# Patient Record
Sex: Male | Born: 1980 | Race: White | Hispanic: No | Marital: Single | Smoking: Current every day smoker
Health system: Southern US, Community
[De-identification: ages and names within clinical notes are randomized; demographics above are authoritative.]

---

## 2009-06-01 ENCOUNTER — Encounter: Admission: RE | Admit: 2009-06-01 | Discharge: 2009-06-01 | Payer: Self-pay | Admitting: Orthopedic Surgery

## 2010-12-28 ENCOUNTER — Ambulatory Visit
Admission: RE | Admit: 2010-12-28 | Discharge: 2010-12-28 | Disposition: A | Discharge status: Home / Self Care | Payer: Self-pay | Source: Ambulatory Visit | Attending: Unknown Physician Specialty | Admitting: Unknown Physician Specialty

## 2010-12-28 ENCOUNTER — Other Ambulatory Visit: Payer: Self-pay | Admitting: Unknown Physician Specialty

## 2010-12-28 DIAGNOSIS — R0602 Shortness of breath: Secondary | ICD-10-CM

## 2010-12-28 DIAGNOSIS — T1490XA Injury, unspecified, initial encounter: Secondary | ICD-10-CM

## 2021-10-23 ENCOUNTER — Emergency Department (INDEPENDENT_AMBULATORY_CARE_PROVIDER_SITE_OTHER)
Admission: EM | Admit: 2021-10-23 | Discharge: 2021-10-23 | Disposition: A | Discharge status: Home / Self Care | Payer: BC Managed Care – PPO | Source: Home / Self Care

## 2021-10-23 ENCOUNTER — Emergency Department (INDEPENDENT_AMBULATORY_CARE_PROVIDER_SITE_OTHER): Payer: BC Managed Care – PPO

## 2021-10-23 DIAGNOSIS — S92535A Nondisplaced fracture of distal phalanx of left lesser toe(s), initial encounter for closed fracture: Secondary | ICD-10-CM

## 2021-10-23 DIAGNOSIS — M79672 Pain in left foot: Secondary | ICD-10-CM

## 2021-10-23 DIAGNOSIS — S99922A Unspecified injury of left foot, initial encounter: Secondary | ICD-10-CM

## 2021-10-23 MED ORDER — HYDROCODONE-ACETAMINOPHEN 5-325 MG PO TABS: 1.0000 | ORAL_TABLET | Freq: Two times a day (BID) | ORAL | 0 refills | Status: AC | PRN

## 2021-10-23 MED ORDER — DOXYCYCLINE HYCLATE 100 MG PO CAPS: 100.0000 mg | ORAL_CAPSULE | Freq: Two times a day (BID) | ORAL | 0 refills | Status: AC

## 2021-10-23 MED ORDER — IBUPROFEN 800 MG PO TABS: 800.0000 mg | ORAL_TABLET | Freq: Two times a day (BID) | ORAL | 0 refills | Status: AC | PRN

## 2021-10-23 NOTE — ED Provider Notes (Signed)
?[facility] ? ? ? ?CSN: [id number] ?Arrival date & time: 10/23/21  1801 ? ? ?  ? ?History   ?Chief Complaint ?Chief Complaint  ?Patient presents with  ? Toe Injury  ?  2nd toe, LT foot  ? ? ?HPI ?Gregory Shepard is a 41 y.o. male.  ? ?HPI 41 year old male presents with left foot pain Sass second toe pain since dropping plywood on his foot.  Bleeding controlled by direct pressure, patient reports Tdap up-to-date and current pain as 3 of 10. ? ?History reviewed. No pertinent past medical history. ? ?There are no problems to display for this patient. ? ? ?History reviewed. No pertinent surgical history. ? ? ? ? ?Home Medications   ? ?Prior to Admission medications   ?Medication Sig Start Date End Date Taking? Authorizing Provider  ?doxycycline (VIBRAMYCIN) 100 MG capsule Take 1 capsule (100 mg total) by mouth 2 (two) times daily for 10 days. 10/23/21 11/02/21 Yes Trevor Ihaagan, Kema Santaella, FNP  ?HYDROcodone-acetaminophen (NORCO) 5-325 MG tablet Take 1 tablet by mouth 2 (two) times daily as needed for moderate pain. 10/23/21  Yes Trevor Ihaagan, Krystyl Cannell, FNP  ?ibuprofen (ADVIL) 800 MG tablet Take 1 tablet (800 mg total) by mouth 2 (two) times daily as needed. 10/23/21  Yes Trevor Ihaagan, Madicyn Mesina, FNP  ? ? ?Family History ?History reviewed. No pertinent family history. ? ?Social History ?Social History  ? ?Tobacco Use  ? Smoking status: Every Day  ?  Packs/day: 0.50  ?  Types: Cigarettes  ? Smokeless tobacco: Never  ?Vaping Use  ? Vaping Use: Never used  ?Substance Use Topics  ? Alcohol use: Yes  ? ? ? ?Allergies   ?Penicillins ? ? ?Review of Systems ?Review of Systems  ?Musculoskeletal:   ?     Left foot/second/third toe pain secondary to dropping wood boards on left foot earlier today.  ?All other systems reviewed and are negative. ? ? ?Physical Exam ?Triage Vital Signs ?ED Triage Vitals  ?Enc Vitals Group  ?   BP --   ?   Pulse Rate 10/23/21 1844 80  ?   Resp 10/23/21 1844 17  ?   Temp --   ?   Temp Source 10/23/21 1844 Oral  ?   SpO2 10/23/21  1844 98 %  ?   Weight --   ?   Height --   ?   Head Circumference --   ?   Peak Flow --   ?   Pain Score 10/23/21 1846 3  ?   Pain Loc --   ?   Pain Edu? --   ?   Excl. in GC? --   ? ?No data found. ? ?Updated Vital Signs ?Pulse 80   Resp 17   SpO2 98%  ? ? ?Physical Exam ?Vitals and nursing note reviewed.  ?Constitutional:   ?   Appearance: Normal appearance. He is normal weight.  ?HENT:  ?   Head: Normocephalic and atraumatic.  ?   Mouth/Throat:  ?   Mouth: Mucous membranes are moist.  ?   Pharynx: Oropharynx is clear.  ?Eyes:  ?   Extraocular Movements: Extraocular movements intact.  ?   Conjunctiva/sclera: Conjunctivae normal.  ?   Pupils: Pupils are equal, round, and reactive to light.  ?Cardiovascular:  ?   Rate and Rhythm: Normal rate and regular rhythm.  ?   Pulses: Normal pulses.  ?   Heart sounds: Normal heart sounds.  ?Pulmonary:  ?   Effort: Pulmonary effort is normal.  ?  Breath sounds: Normal breath sounds. No wheezing, rhonchi or rales.  ?Musculoskeletal:  ?   Cervical back: Normal range of motion and neck supple.  ?   Comments: Left foot (dorsum): TTP over second and third toes, with mild skin abrasion noted over second distal phalanx, homeostasis achieved by direct pressure  ?Skin: ?   General: Skin is warm and dry.  ?Neurological:  ?   Mental Status: He is alert.  ? ? ? ?UC Treatments / Results  ?Labs ?(all labs ordered are listed, but only abnormal results are displayed) ?Labs Reviewed - No data to display ? ?EKG ? ? ?Radiology ?DG Foot Complete Left ? ?Result Date: 10/23/2021 ?CLINICAL DATA:  Dropped heavy object on foot.  Injury to 2nd toe. EXAM: LEFT FOOT - COMPLETE 3+ VIEW COMPARISON:  None. FINDINGS: Fractures are noted through the distal phalanges of the left 2nd and 3rd toes. No subluxation or dislocation. No radiopaque foreign bodies. Joint spaces maintained. IMPRESSION: Left 2nd and 3rd distal phalangeal fractures. Electronically Signed   By: Charlett Nose M.D.   On: 10/23/2021 19:12    ? ?Procedures ?Procedures (including critical care time) ? ?Medications Ordered in UC ?Medications - No data to display ? ?Initial Impression / Assessment and Plan / UC Course  ?I have reviewed the triage vital signs and the nursing notes. ? ?Pertinent labs & imaging results that were available during my care of the patient were reviewed by me and considered in my medical decision making (see chart for details). ? ?  ? ?MDM: 1.  Nondisplaced fracture of distal phalanx of lesser toes, initial encounter for closed fracture-Rx'd Ibuprofen and Norco. Advised patient of left foot x-ray results with hard copy provided to patient.  Advised patient may use Ibuprofen 800 mg 1-2 times daily, as needed pain and swelling of left second and third toes.  Advised patient may use Norco for breakthrough pain.  Advised patient to contact [place] orthopedic provider first thing tomorrow morning, Wednesday, 10/24/2021 for further evaluation of left second and third distal phalanx fractures.  Advised patient to wear left cam walker boot 24/7 (except when bathing and sleeping) until follow-up with orthopedic provider.  ?Final Clinical Impressions(s) / UC Diagnoses  ? ?Final diagnoses:  ?Left foot pain  ?Nondisplaced fracture of distal phalanx of left lesser toe(s), initial encounter for closed fracture  ? ? ? ?Discharge Instructions   ? ?  ?Advised patient of left foot x-ray results with hard copy provided to patient.  Advised patient may use Ibuprofen 800 mg 1-2 times daily, as needed pain and swelling of left second and third toes.  Advised patient may use Norco for breakthrough pain.  Advised patient to contact  orthopedic provider first thing tomorrow morning, Wednesday, 10/24/2021 for further evaluation of left second and third distal phalanx fractures.  Advised patient to wear left cam walker boot 24/7 (except when bathing and sleeping) until follow-up with orthopedic provider.  Advised patient will start Doxycycline  twice daily for the next 10 days to prevent cellulitis of second and third toes. ? ? ? ? ?ED Prescriptions   ? ? Medication Sig Dispense Auth. Provider  ? ibuprofen (ADVIL) 800 MG tablet Take 1 tablet (800 mg total) by mouth 2 (two) times daily as needed. 30 tablet Trevor Iha, FNP  ? HYDROcodone-acetaminophen (NORCO) 5-325 MG tablet Take 1 tablet by mouth 2 (two) times daily as needed for moderate pain. 30 tablet Trevor Iha, FNP  ? doxycycline (VIBRAMYCIN) 100 MG capsule  Take 1 capsule (100 mg total) by mouth 2 (two) times daily for 10 days. 20 capsule Trevor Iha, FNP  ? ?  ? ?I have reviewed the PDMP during this encounter. ?  ?Trevor Iha, FNP ?10/23/21 1957 ? ?

## 2021-10-23 NOTE — ED Notes (Signed)
POST OP SHOE ORDER CANCELED. PT HAS CAM BOOK WALKER AT HOME ALREADY ?

## 2021-10-23 NOTE — ED Triage Notes (Addendum)
Pt c/o LT foot pain, 2nd toe since dropping plywood on his foot. Bleeding controlled. Tdap up to date. Pain 3/10 ?

## 2021-10-23 NOTE — Discharge Instructions (Addendum)
Advised patient of left foot x-ray results with hard copy provided to patient.  Advised patient may use Ibuprofen 800 mg 1-2 times daily, as needed pain and swelling of left second and third toes.  Advised patient may use Norco for breakthrough pain.  Advised patient to contact [place] orthopedic provider first thing tomorrow morning, Wednesday, 10/24/2021 for further evaluation of left second and third distal phalanx fractures.  Advised patient to wear left cam walker boot 24/7 (except when bathing and sleeping) until follow-up with orthopedic provider.  Advised patient will start Doxycycline twice daily for the next 10 days to prevent cellulitis of second and third toes. ?

## 2023-01-18 IMAGING — DX DG FOOT COMPLETE 3+V*L*
3 series · 3 of 3 positions shown · non-contrast
Comparison: None.

CLINICAL DATA: Dropped heavy object on foot.  Injury to 2nd toe.

EXAM:
LEFT FOOT - COMPLETE 3+ VIEW

[foot ap]
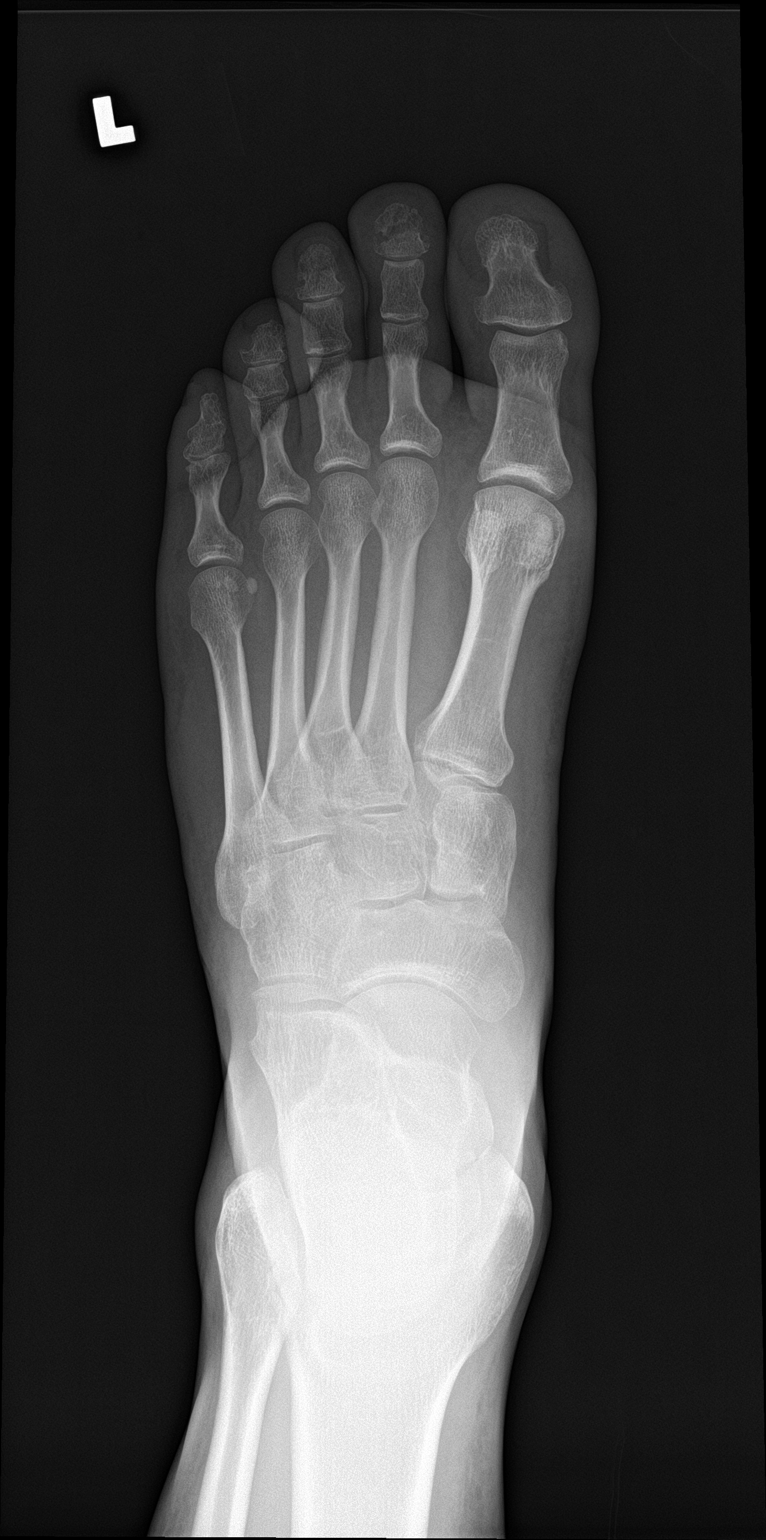

[foot obl]
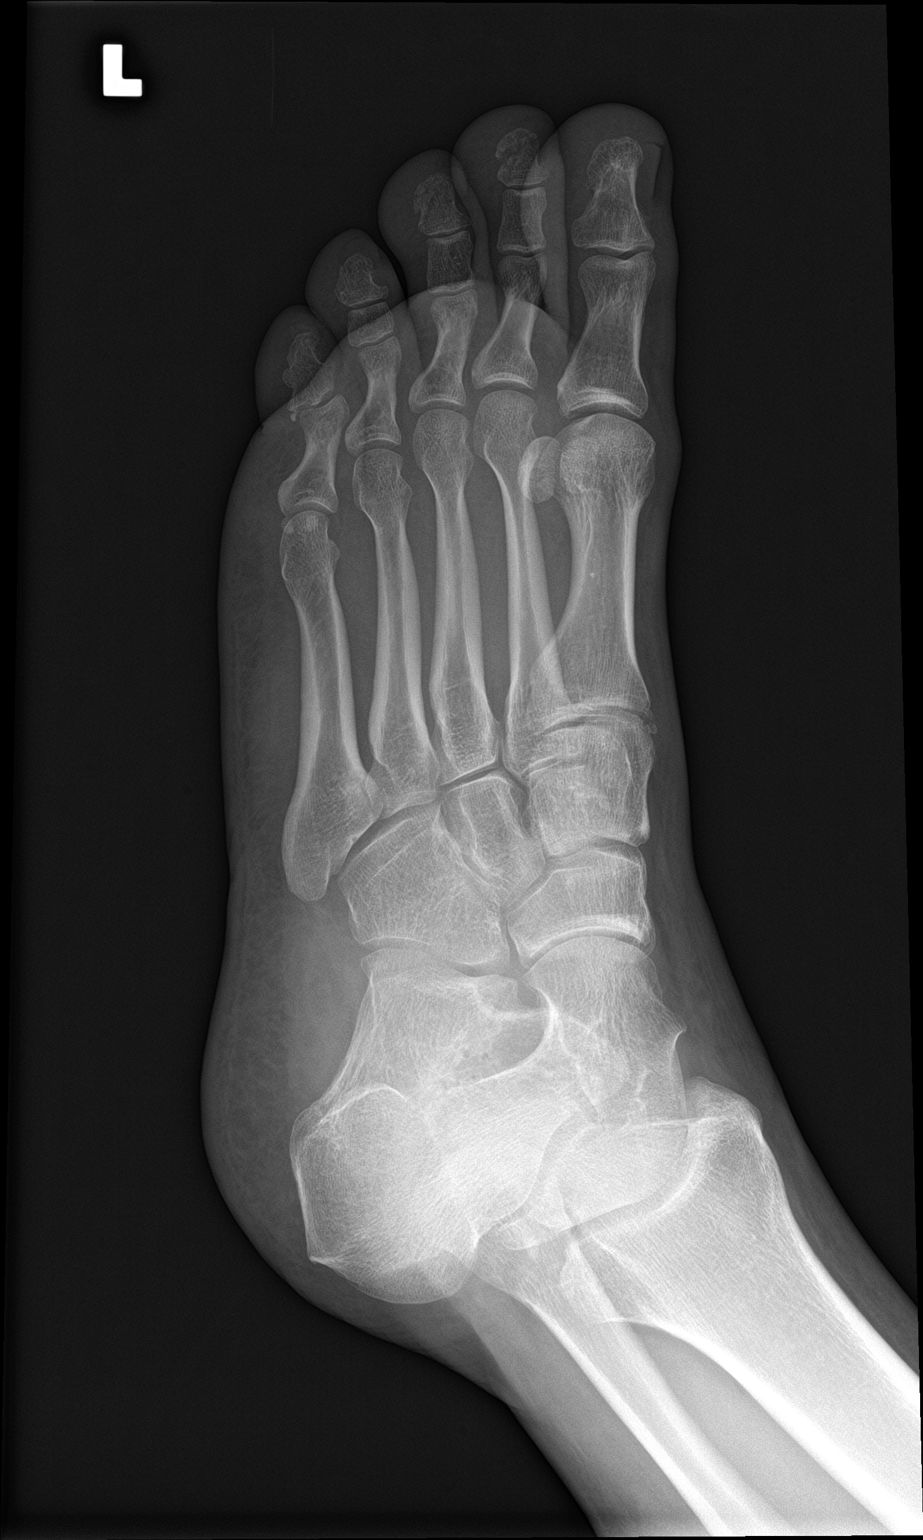

[foot lat]
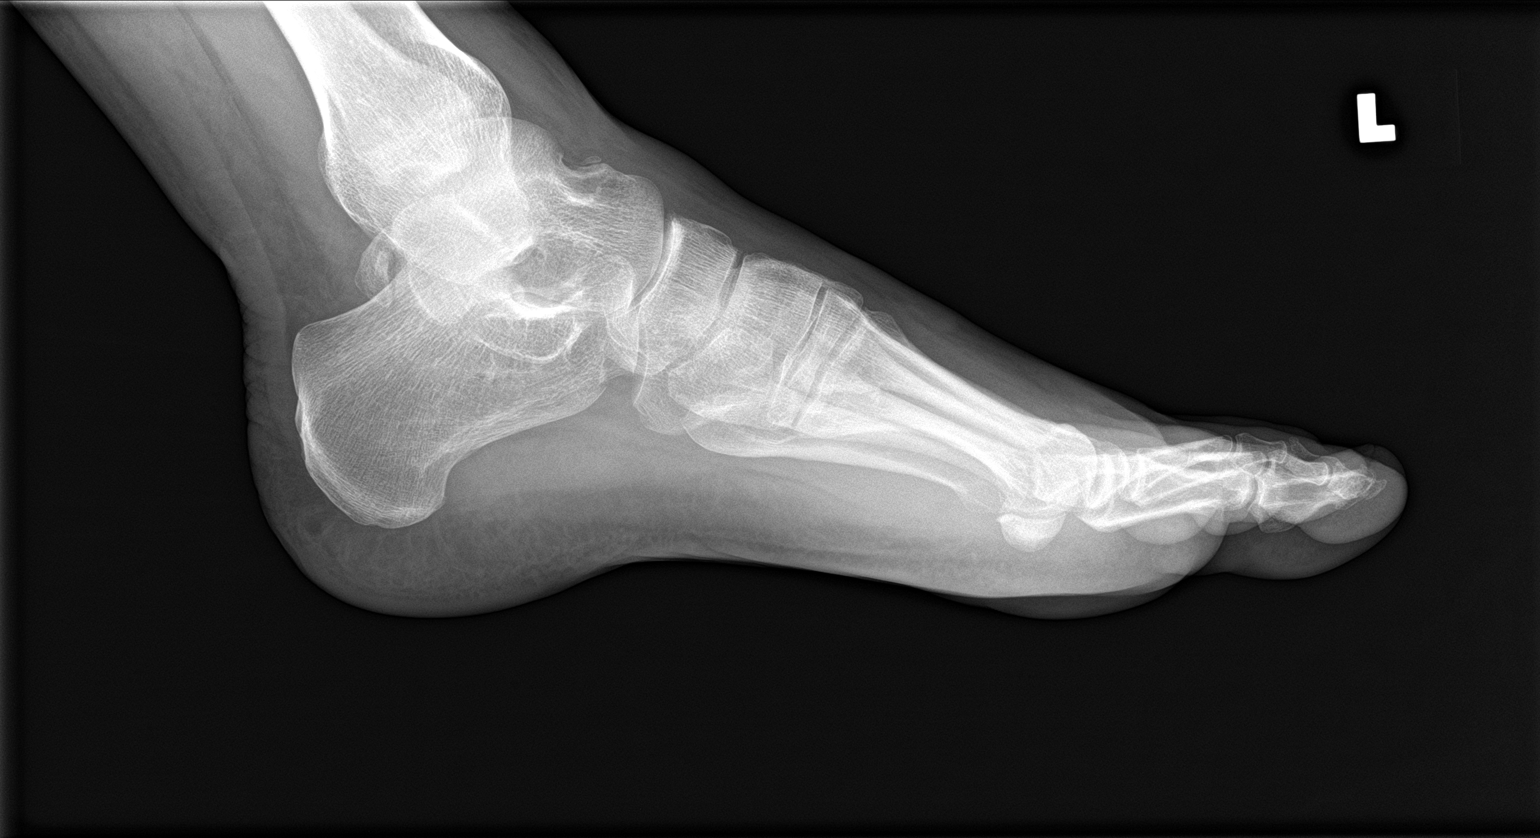

[3 of 3 positions shown; findings below may reference images not displayed]

FINDINGS: Fractures are noted through the distal phalanges of the left 2nd and
3rd toes. No subluxation or dislocation. No radiopaque foreign
bodies. Joint spaces maintained.
IMPRESSION: Left 2nd and 3rd distal phalangeal fractures.
